# Patient Record
Sex: Female | Born: 1940 | Race: Black or African American | Hispanic: No | Smoking: Never smoker
Health system: Southern US, Community
[De-identification: ages and names within clinical notes are randomized; demographics above are authoritative.]

## PROBLEM LIST (undated history)

## (undated) DIAGNOSIS — I10 Essential (primary) hypertension: Secondary | ICD-10-CM

## (undated) HISTORY — PX: APPENDECTOMY: SHX54

## (undated) HISTORY — PX: ECTOPIC PREGNANCY SURGERY: SHX613

---

## 2010-07-14 ENCOUNTER — Emergency Department (HOSPITAL_COMMUNITY): Admission: EM | Admit: 2010-07-14 | Discharge: 2010-07-14 | Payer: Self-pay | Admitting: Emergency Medicine

## 2011-11-11 ENCOUNTER — Emergency Department (HOSPITAL_COMMUNITY)
Admission: EM | Admit: 2011-11-11 | Discharge: 2011-11-11 | Disposition: A | Discharge status: Home / Self Care | Payer: Medicare PPO | Attending: Emergency Medicine | Admitting: Emergency Medicine

## 2011-11-11 ENCOUNTER — Encounter: Payer: Self-pay | Admitting: *Deleted

## 2011-11-11 DIAGNOSIS — I10 Essential (primary) hypertension: Secondary | ICD-10-CM | POA: Insufficient documentation

## 2011-11-11 DIAGNOSIS — J40 Bronchitis, not specified as acute or chronic: Secondary | ICD-10-CM | POA: Insufficient documentation

## 2011-11-11 HISTORY — DX: Essential (primary) hypertension: I10

## 2011-11-11 MED ORDER — AZITHROMYCIN 250 MG PO TABS: ORAL_TABLET | ORAL | Status: DC

## 2011-11-11 NOTE — ED Provider Notes (Signed)
History   This chart was scribed for Benny Lennert, MD by Clarita Crane. The patient was seen in room APA10/APA10 and the patient's care was started at 8:39AM.   CSN: [id number] Arrival date & time: 11/11/2011  8:13 AM   First MD Initiated Contact with Patient 11/11/11 [phone]      Chief Complaint  Patient presents with  . Cough    (Consider location/radiation/quality/duration/timing/severity/associated sxs/prior treatment) HPI Jean Miles is a 70 y.o. female who presents to the Emergency Department complaining of constant moderate productive cough with yellow sputum onset several days ago with associated chills, myalgias, nasal congestion, chest congestion. Denies fever, sore throat, nausea. Patient reports that she received flu shot several weeks ago. Patient with h/o hypertension.   Past Medical History  Diagnosis Date  . Hypertension     Past Surgical History  Procedure Date  . Ectopic pregnancy surgery   . Appendectomy     No family history on file.  History  Substance Use Topics  . Smoking status: Never Smoker   . Smokeless tobacco: Not on file  . Alcohol Use: No    OB History    Grav Para Term Preterm Abortions TAB SAB Ect Mult Living                  Review of Systems  Constitutional: Positive for chills. Negative for fever and fatigue.  HENT: Positive for congestion. Negative for sore throat, sinus pressure and ear discharge.   Eyes: Negative for discharge.  Respiratory: Positive for cough.   Cardiovascular: Negative for chest pain.  Gastrointestinal: Negative for nausea, abdominal pain and diarrhea.  Genitourinary: Negative for frequency and hematuria.  Musculoskeletal: Positive for myalgias. Negative for back pain.  Skin: Negative for rash.  Neurological: Negative for seizures and headaches.  Hematological: Negative.   Psychiatric/Behavioral: Negative for hallucinations.    Allergies  Penicillins  Home Medications   Current Outpatient Rx    Name Route Sig Dispense Refill  . AZITHROMYCIN 250 MG PO TABS  Take 2 tablets today the one each day after 6 tablet 0    BP 141/66  Pulse 58  Temp(Src) 98 F (36.7 C) (Oral)  Resp 20  Ht 5\' 5"  (1.651 m)  Wt 178 lb (80.74 kg)  BMI 29.62 kg/m2  SpO2 97%  Physical Exam  Nursing note and vitals reviewed. Constitutional: She is oriented to person, place, and time. She appears well-developed and well-nourished. No distress.  HENT:  Head: Normocephalic and atraumatic.  Mouth/Throat: Oropharynx is clear and moist.       Mild maxillary sinus tenderness.   Eyes: EOM are normal. Pupils are equal, round, and reactive to light.  Neck: Neck supple. No tracheal deviation present.  Cardiovascular: Normal rate, regular rhythm and intact distal pulses.  Exam reveals no gallop and no friction rub.   No murmur heard. Pulmonary/Chest: Effort normal. No respiratory distress. She has no wheezes.  Abdominal: Soft. She exhibits no distension. There is no tenderness.  Musculoskeletal: Normal range of motion. She exhibits no edema.  Neurological: She is alert and oriented to person, place, and time. No sensory deficit.  Skin: Skin is warm and dry.  Psychiatric: She has a normal mood and affect. Her behavior is normal.    ED Course  Procedures (including critical care time)  DIAGNOSTIC STUDIES: Oxygen Saturation is 97% on room air, normal by my interpretation.    COORDINATION OF CARE:    Labs Reviewed - No data to display No  results found.   1. Bronchitis       MDM        The chart was scribed for me under my direct supervision.  I personally performed the history, physical, and medical decision making and all procedures in the evaluation of this patient.Benny Lennert, MD 11/11/11 [phone]

## 2011-11-11 NOTE — ED Notes (Signed)
Cough, congestion, runny nose x 4 days.  Taking OTC meds without relief.  Denies fever/sob.

## 2013-09-12 ENCOUNTER — Emergency Department (HOSPITAL_COMMUNITY)
Admission: EM | Admit: 2013-09-12 | Discharge: 2013-09-12 | Disposition: A | Discharge status: Home / Self Care | Payer: Medicare PPO | Attending: Emergency Medicine | Admitting: Emergency Medicine

## 2013-09-12 ENCOUNTER — Encounter (HOSPITAL_COMMUNITY): Payer: Self-pay | Admitting: Emergency Medicine

## 2013-09-12 DIAGNOSIS — S39012A Strain of muscle, fascia and tendon of lower back, initial encounter: Secondary | ICD-10-CM

## 2013-09-12 DIAGNOSIS — I10 Essential (primary) hypertension: Secondary | ICD-10-CM | POA: Insufficient documentation

## 2013-09-12 DIAGNOSIS — S335XXA Sprain of ligaments of lumbar spine, initial encounter: Secondary | ICD-10-CM | POA: Insufficient documentation

## 2013-09-12 DIAGNOSIS — Z880 Allergy status to penicillin: Secondary | ICD-10-CM | POA: Insufficient documentation

## 2013-09-12 DIAGNOSIS — Z792 Long term (current) use of antibiotics: Secondary | ICD-10-CM | POA: Insufficient documentation

## 2013-09-12 DIAGNOSIS — Y929 Unspecified place or not applicable: Secondary | ICD-10-CM | POA: Insufficient documentation

## 2013-09-12 DIAGNOSIS — X58XXXA Exposure to other specified factors, initial encounter: Secondary | ICD-10-CM | POA: Insufficient documentation

## 2013-09-12 DIAGNOSIS — I498 Other specified cardiac arrhythmias: Secondary | ICD-10-CM | POA: Insufficient documentation

## 2013-09-12 DIAGNOSIS — Y939 Activity, unspecified: Secondary | ICD-10-CM | POA: Insufficient documentation

## 2013-09-12 LAB — URINALYSIS, ROUTINE W REFLEX MICROSCOPIC
Bilirubin Urine: NEGATIVE
Glucose, UA: NEGATIVE mg/dL
Nitrite: NEGATIVE
Protein, ur: NEGATIVE mg/dL
pH: 6.5 (ref 5.0–8.0)

## 2013-09-12 MED ORDER — METHOCARBAMOL 500 MG PO TABS: 500.0000 mg | ORAL_TABLET | Freq: Two times a day (BID) | ORAL | Status: DC

## 2013-09-12 NOTE — ED Notes (Signed)
Pt ambulated out with steady gait, pt refused wheelchair at time of discharge

## 2013-09-12 NOTE — ED Provider Notes (Signed)
Medical screening examination/treatment/procedure(s) were performed by non-physician practitioner and as supervising physician I was immediately available for consultation/collaboration. Devoria Albe, MD, Armando Gang    Ward Givens, MD 09/12/13 [phone]

## 2013-09-12 NOTE — ED Notes (Signed)
Pt with right lower back pain x 4-5 days, denies fever or N/V, denies burning or pain with urination

## 2013-09-12 NOTE — ED Notes (Signed)
R lower back pain began 4 days ago.  Intermittent, sharp when patient lying or sitting - not as much pain w/walking.  Does not recall any injury.

## 2013-09-12 NOTE — ED Provider Notes (Signed)
CSN: [id number]     Arrival date & time 09/12/13  1042 History   First MD Initiated Contact with Patient 09/12/13 1100     Chief Complaint  Patient presents with  . Back Pain   (Consider location/radiation/quality/duration/timing/severity/associated sxs/prior Treatment) Patient is a 72 y.o. female presenting with back pain. The history is provided by the patient.  Back Pain Location:  Lumbar spine Quality:  Shooting Pain severity:  Moderate Pain is:  Same all the time Onset quality:  Gradual Duration:  4 days Timing:  Intermittent Chronicity:  New Relieved by:  None tried Worsened by:  Movement, ambulation and twisting Ineffective treatments:  OTC medications Associated symptoms: no bladder incontinence, no bowel incontinence, no dysuria, no fever, no leg pain and no weakness    Jean Miles is a 72 y.o. female who presents to the ED with right lower back pain that started 4 days ago. The pain comes and goes. She describes the pain as sharp. She does not remember any injury but she does walk every day. She got her flu shot 3 days ago in her right arm. Took tylenol and Excedrin which gave temporary relief.    Past Medical History  Diagnosis Date  . Hypertension    Past Surgical History  Procedure Laterality Date  . Ectopic pregnancy surgery    . Appendectomy     History reviewed. No pertinent family history. History  Substance Use Topics  . Smoking status: Never Smoker   . Smokeless tobacco: Not on file  . Alcohol Use: No   OB History   Grav Para Term Preterm Abortions TAB SAB Ect Mult Living                 Review of Systems  Constitutional: Negative for fever.  Gastrointestinal: Negative for bowel incontinence.  Genitourinary: Negative for bladder incontinence and dysuria.  Musculoskeletal: Positive for back pain.  Neurological: Negative for weakness.    Allergies  Penicillins  Home Medications   Current Outpatient Rx  Name  Route  Sig  Dispense   Refill  . azithromycin (ZITHROMAX Z-PAK) 250 MG tablet      Take 2 tablets today the one each day after   6 tablet   0    BP 173/72  Pulse 51  Temp(Src) 98.4 F (36.9 C) (Oral)  Resp 16  Ht 5\' 5"  (1.651 m)  Wt 183 lb (83.008 kg)  BMI 30.45 kg/m2  SpO2 100% Physical Exam  Nursing note and vitals reviewed. Constitutional: She is oriented to person, place, and time. She appears well-developed and well-nourished.  HENT:  Head: Normocephalic and atraumatic.  Eyes: Conjunctivae and EOM are normal.  Neck: Neck supple.  Cardiovascular: Regular rhythm.  Bradycardia present.   Pulmonary/Chest: Effort normal and breath sounds normal.  Abdominal: Soft. There is no tenderness.  Musculoskeletal: Normal range of motion.       Lumbar back: She exhibits tenderness. She exhibits normal range of motion, no spasm and normal pulse.       Back:  Pedal pulses equal bilateral, adequate circulation, good touch sensation. Ambulatory without difficulty.  Neurological: She is alert and oriented to person, place, and time. No cranial nerve deficit.  Skin: Skin is warm and dry.  Psychiatric: She has a normal mood and affect. Her behavior is normal.    ED Course: Dr. Lynelle Doctor in to see the patient.   Procedures  EKG Interpretation   None      Results for orders placed during  the hospital encounter of 09/12/13 (from the past 24 hour(s))  URINALYSIS, ROUTINE W REFLEX MICROSCOPIC     Status: None   Collection Time    09/12/13 11:51 AM      Result Value Range   Color, Urine YELLOW  YELLOW   APPearance CLEAR  CLEAR   Specific Gravity, Urine 1.015  1.005 - 1.030   pH 6.5  5.0 - 8.0   Glucose, UA NEGATIVE  NEGATIVE mg/dL   Hgb urine dipstick NEGATIVE  NEGATIVE   Bilirubin Urine NEGATIVE  NEGATIVE   Ketones, ur NEGATIVE  NEGATIVE mg/dL   Protein, ur NEGATIVE  NEGATIVE mg/dL   Urobilinogen, UA 0.2  0.0 - 1.0 mg/dL   Nitrite NEGATIVE  NEGATIVE   Leukocytes, UA NEGATIVE  NEGATIVE    MDM  72 y.o.  female with right lumbar muscle spasm x 4 days. Will treat with muscle relaxant. Discussed with the patient and her daughter side affects of Robaxin including drowsiness. She will try the medication at bedtime to determine how it makes her feel. Patient is stable for discharge home without any immediate complications. She will follow up with her PCP. She will return here as needed for any problems.    Medication List    TAKE these medications       methocarbamol 500 MG tablet  Commonly known as:  ROBAXIN  Take 1 tablet (500 mg total) by mouth 2 (two) times daily.      ASK your doctor about these medications       azithromycin 250 MG tablet  Commonly known as:  ZITHROMAX Z-PAK  Take 2 tablets today the one each day after           Janne Napoleon, NP 09/12/13 1233

## 2013-09-12 NOTE — ED Provider Notes (Signed)
12:22 PM- The pt states the pain to her lower back began approximately four days ago. Changing positions or bending increases the pain. Advised the pt that her UA looked normal. Informed the pt that she most likely had a muscle strain. The pt reports she used a saw to cut down several bushes two weeks ago.   Pt has tenderness in her lateral right flank, she has pain with lateral flexion of her waist with the right worse than the left.   Medical screening examination/treatment/procedure(s) were conducted as a shared visit with non-physician practitioner(s) and myself.  I personally evaluated the patient during the encounter   Devoria Albe, MD, FACEP  I personally performed the services described in this documentation, which was scribed in my presence. The recorded information has been reviewed and considered.  Devoria Albe, MD, Armando Gang   Ward Givens, MD 09/12/13 [phone]

## 2013-09-15 ENCOUNTER — Encounter (HOSPITAL_COMMUNITY): Payer: Self-pay | Admitting: Emergency Medicine

## 2013-09-15 ENCOUNTER — Emergency Department (HOSPITAL_COMMUNITY): Payer: Medicare PPO

## 2013-09-15 ENCOUNTER — Emergency Department (HOSPITAL_COMMUNITY)
Admission: EM | Admit: 2013-09-15 | Discharge: 2013-09-15 | Disposition: A | Discharge status: Home / Self Care | Payer: Medicare PPO | Attending: Emergency Medicine | Admitting: Emergency Medicine

## 2013-09-15 DIAGNOSIS — Z792 Long term (current) use of antibiotics: Secondary | ICD-10-CM | POA: Insufficient documentation

## 2013-09-15 DIAGNOSIS — M25559 Pain in unspecified hip: Secondary | ICD-10-CM | POA: Insufficient documentation

## 2013-09-15 DIAGNOSIS — I10 Essential (primary) hypertension: Secondary | ICD-10-CM | POA: Insufficient documentation

## 2013-09-15 DIAGNOSIS — Z880 Allergy status to penicillin: Secondary | ICD-10-CM | POA: Insufficient documentation

## 2013-09-15 DIAGNOSIS — Z79899 Other long term (current) drug therapy: Secondary | ICD-10-CM | POA: Insufficient documentation

## 2013-09-15 DIAGNOSIS — M25551 Pain in right hip: Secondary | ICD-10-CM

## 2013-09-15 MED ORDER — PREDNISONE 20 MG PO TABS: ORAL_TABLET | ORAL | Status: DC

## 2013-09-15 MED ORDER — NAPROXEN 500 MG PO TABS: 500.0000 mg | ORAL_TABLET | Freq: Two times a day (BID) | ORAL | Status: DC

## 2013-09-15 MED ORDER — KETOROLAC TROMETHAMINE 60 MG/2ML IM SOLN
60.0000 mg | Freq: Once | INTRAMUSCULAR | Status: AC
  Administered 2013-09-15: 60 mg via INTRAMUSCULAR
  Filled 2013-09-15: qty 2

## 2013-09-15 NOTE — ED Notes (Signed)
Pt on shock time until 1500.

## 2013-09-15 NOTE — ED Provider Notes (Signed)
CSN: [id number]     Arrival date & time 09/15/13  1104 History  This chart was scribed for Jean Hutching, MD by Karle Plumber, ED Scribe. This patient was seen in room APA03/APA03 and the patient's care was started at 11:32 AM.  Chief Complaint  Patient presents with  . Back Pain   The history is provided by the patient. No language interpreter was used.   HPI Comments:  Jean Miles is a 72 y.o. female who presents to the Emergency Department complaining of sudden onset, sharp, intermittent lower right back/pelvis pain that radiates to her right leg onset 3 days ago. She is able to ambulate without issue. She denies injury or fall.   Past Medical History  Diagnosis Date  . Hypertension    Past Surgical History  Procedure Laterality Date  . Ectopic pregnancy surgery    . Appendectomy     No family history on file. History  Substance Use Topics  . Smoking status: Never Smoker   . Smokeless tobacco: Not on file  . Alcohol Use: No   OB History   Grav Para Term Preterm Abortions TAB SAB Ect Mult Living                 Review of Systems  Musculoskeletal: Positive for back pain and myalgias.  A complete 10 system review of systems was obtained and all systems are negative except as noted in the HPI and PMH.   Allergies  Penicillins  Home Medications   Current Outpatient Rx  Name  Route  Sig  Dispense  Refill  . azithromycin (ZITHROMAX Z-PAK) 250 MG tablet      Take 2 tablets today the one each day after   6 tablet   0   . methocarbamol (ROBAXIN) 500 MG tablet   Oral   Take 1 tablet (500 mg total) by mouth 2 (two) times daily.   20 tablet   0    Triage Vitals: BP 145/62  Pulse 60  Temp(Src) 98.2 F (36.8 C) (Oral)  Resp 18  Ht 5\' 6"  (1.676 m)  Wt 183 lb (83.008 kg)  BMI 29.55 kg/m2  SpO2 99% Physical Exam  Nursing note and vitals reviewed. Constitutional: She is oriented to person, place, and time. She appears well-developed and well-nourished.  HENT:   Head: Normocephalic and atraumatic.  Eyes: Conjunctivae and EOM are normal. Pupils are equal, round, and reactive to light.  Neck: Normal range of motion. Neck supple.  Cardiovascular: Normal rate, regular rhythm and normal heart sounds.   Pulmonary/Chest: Effort normal and breath sounds normal.  Abdominal: Soft. Bowel sounds are normal.  Musculoskeletal: Normal range of motion.  Tender on left lateral hip. Straight leg raises without pain.  Neurological: She is alert and oriented to person, place, and time.  Skin: Skin is warm and dry.  Psychiatric: She has a normal mood and affect.    ED Course  Procedures (including critical care time) DIAGNOSTIC STUDIES: Oxygen Saturation is 99% on RA, normal by my interpretation.   COORDINATION OF CARE: 11:39 AM- Will obtain an X-Ray. Pt verbalizes understanding and agrees to plan.  Medications - No data to display  Labs Review Labs Reviewed - No data to display Imaging Review Dg Hip Complete Right  09/15/2013   CLINICAL DATA:  Hip pain. No known injury.  EXAM: RIGHT HIP - COMPLETE 2+ VIEW  COMPARISON:  None.  FINDINGS: Both hips are normally located. No acute hip fracture or plain film evidence  of avascular necrosis. The hips demonstrate mild to moderate degenerative changes. The pubic symphysis and SI joints are intact. No pelvic fractures.  IMPRESSION: Mild to moderate degenerative changes but no acute bony findings   Electronically Signed   By: Loralie Champagne M.D.   On: 09/15/2013 12:15    EKG Interpretation   None       MDM  No diagnosis found. Uncertain etiology of patient's right lateral hip pain. Could be trochanteric bursitis.  Rx Naprosyn 500 mg and prednisone. Followup with orthopedics.  I personally performed the services described in this documentation, which was scribed in my presence. The recorded information has been reviewed and is accurate.    Jean Hutching, MD 09/15/13 [phone]

## 2013-09-15 NOTE — ED Notes (Signed)
MD at bedside. 

## 2013-09-15 NOTE — ED Notes (Signed)
Pt c/o pain in lower back x 3 days.  Reports was seen here for same.  Pt says now pain radiates into r leg.  Denies injury.

## 2013-10-02 ENCOUNTER — Encounter: Payer: Self-pay | Admitting: Orthopedic Surgery

## 2013-10-02 ENCOUNTER — Ambulatory Visit: Payer: Medicare FFS

## 2013-10-02 ENCOUNTER — Ambulatory Visit (INDEPENDENT_AMBULATORY_CARE_PROVIDER_SITE_OTHER): Payer: Medicare PPO | Admitting: Orthopedic Surgery

## 2013-10-02 VITALS — BP 154/78 | Ht 65.0 in | Wt 181.0 lb

## 2013-10-02 DIAGNOSIS — M76899 Other specified enthesopathies of unspecified lower limb, excluding foot: Secondary | ICD-10-CM

## 2013-10-02 DIAGNOSIS — M79609 Pain in unspecified limb: Secondary | ICD-10-CM

## 2013-10-02 DIAGNOSIS — M7070 Other bursitis of hip, unspecified hip: Secondary | ICD-10-CM | POA: Insufficient documentation

## 2013-10-02 DIAGNOSIS — M7071 Other bursitis of hip, right hip: Secondary | ICD-10-CM

## 2013-10-02 DIAGNOSIS — M79604 Pain in right leg: Secondary | ICD-10-CM

## 2013-10-02 MED ORDER — HYDROCODONE-ACETAMINOPHEN 5-325 MG PO TABS: 1.0000 | ORAL_TABLET | Freq: Four times a day (QID) | ORAL | Status: DC | PRN

## 2013-10-02 NOTE — Progress Notes (Signed)
  Subjective:    Patient ID: Jean Miles, female    DOB: 1941-08-29, 72 y.o.   MRN: [id number]  HPI Comments: The patient walks 4 miles per day denies back pain  Leg Pain  The incident occurred more than 1 week ago. The incident occurred at home. There was no injury mechanism. The pain is present in the right thigh. The quality of the pain is described as burning Lambert Mody, throbbing). The pain is at a severity of 8/10 (Relieved by oxycodone). The pain has been intermittent since onset. Associated symptoms include numbness and tingling. Pertinent negatives include no inability to bear weight or loss of motion.      Review of Systems  Neurological: Positive for tingling and numbness.   heartburn joint pain muscle pain     Objective:   Physical Exam BP 154/78  Ht 5\' 5"  (1.651 m)  Wt 181 lb (82.101 kg)  BMI 30.12 kg/m2 General appearance is normal, the patient is alert and oriented x3 with normal mood and affect. The patient's gait and station are normal. She has hip flexion of 90 bilaterally but no pain with hip flexion or internal or external rotation of the hip she has no tenderness in the back of both hips and knees are stable muscle strength and tone are normal skin is intact she has a good pulse normal temperature in both feet reflexes are 2+ and normal is tenderness over the right greater trochanter  The hip x-rays show mild degenerative changes nothing significant       Assessment & Plan:   Encounter Diagnoses  Name Primary?  . Right leg pain Yes  . Hip bursitis, right     Recommend injection right hip patient consented verbally   Inject RIGHT hip bursa Hip  Injection Procedure Note  Pre-operative Diagnosis: right hip Bursitis Post-operative Diagnosis: same  Indications: pain  Anesthesia: ethyl chloride   Procedure Details   Verbal consent was obtained for the procedure. Time out was completed.The RIGHT greater trochanter was prepped with alcohol, followed  by  Ethyl chloride spray and A 25 gauge needle was inserted into the area of maximal tenderness.  1ml 1% lidocaine and 1 ml of depomedrol  was then injected into the bursa . The needle was removed and the area cleansed and dressed.  Complications:  None; patient tolerated the procedure well.

## 2013-10-02 NOTE — Addendum Note (Signed)
Addended by: Vickki Hearing on: 10/02/2013 09:46 AM   Modules accepted: Orders

## 2013-10-02 NOTE — Patient Instructions (Addendum)
Bursitis hip Hip Bursitis Bursitis is a puffiness (swelling) and soreness of a fluid-filled sac (bursa). This sac covers and protects the joint. HOME CARE  Put ice on the injured area.  Put ice in a plastic bag.  Place a towel between your skin and the bag.  Leave the ice on for 15-20 minutes, 3-4 times a day.  Rest the painful joint as much as possible. Move your joint at least 4 times a day. When pain lessens, start normal, slow movements and normal activities.  Only take medicine as told by your doctor.  Use crutches as told.  Raise (elevate) your painful joint. Use pillows for propping your legs and hips.  Get a massage to lessen pain. GET HELP RIGHT AWAY IF:  Your pain increases or does not improve during treatment.  You have a fever.  You feel heat coming from the affected area.  You see redness and puffiness around the affected area.  You have any questions or concerns. MAKE SURE YOU:  Understand these instructions.  Will watch your condition.  Will get help right away if you are not well or get worse. Document Released: 12/17/2010 Document Revised: 02/06/2012 Document Reviewed: 12/17/2010 [facility] Patient Information 2014 [place], [place].

## 2014-02-17 IMAGING — CR DG HIP COMPLETE 2+V*R*
3 series · 3 of 3 positions shown · non-contrast
Comparison: None.

CLINICAL DATA: Hip pain. No known injury.

EXAM:
RIGHT HIP - COMPLETE 2+ VIEW

[view not recorded (1 of 3)]
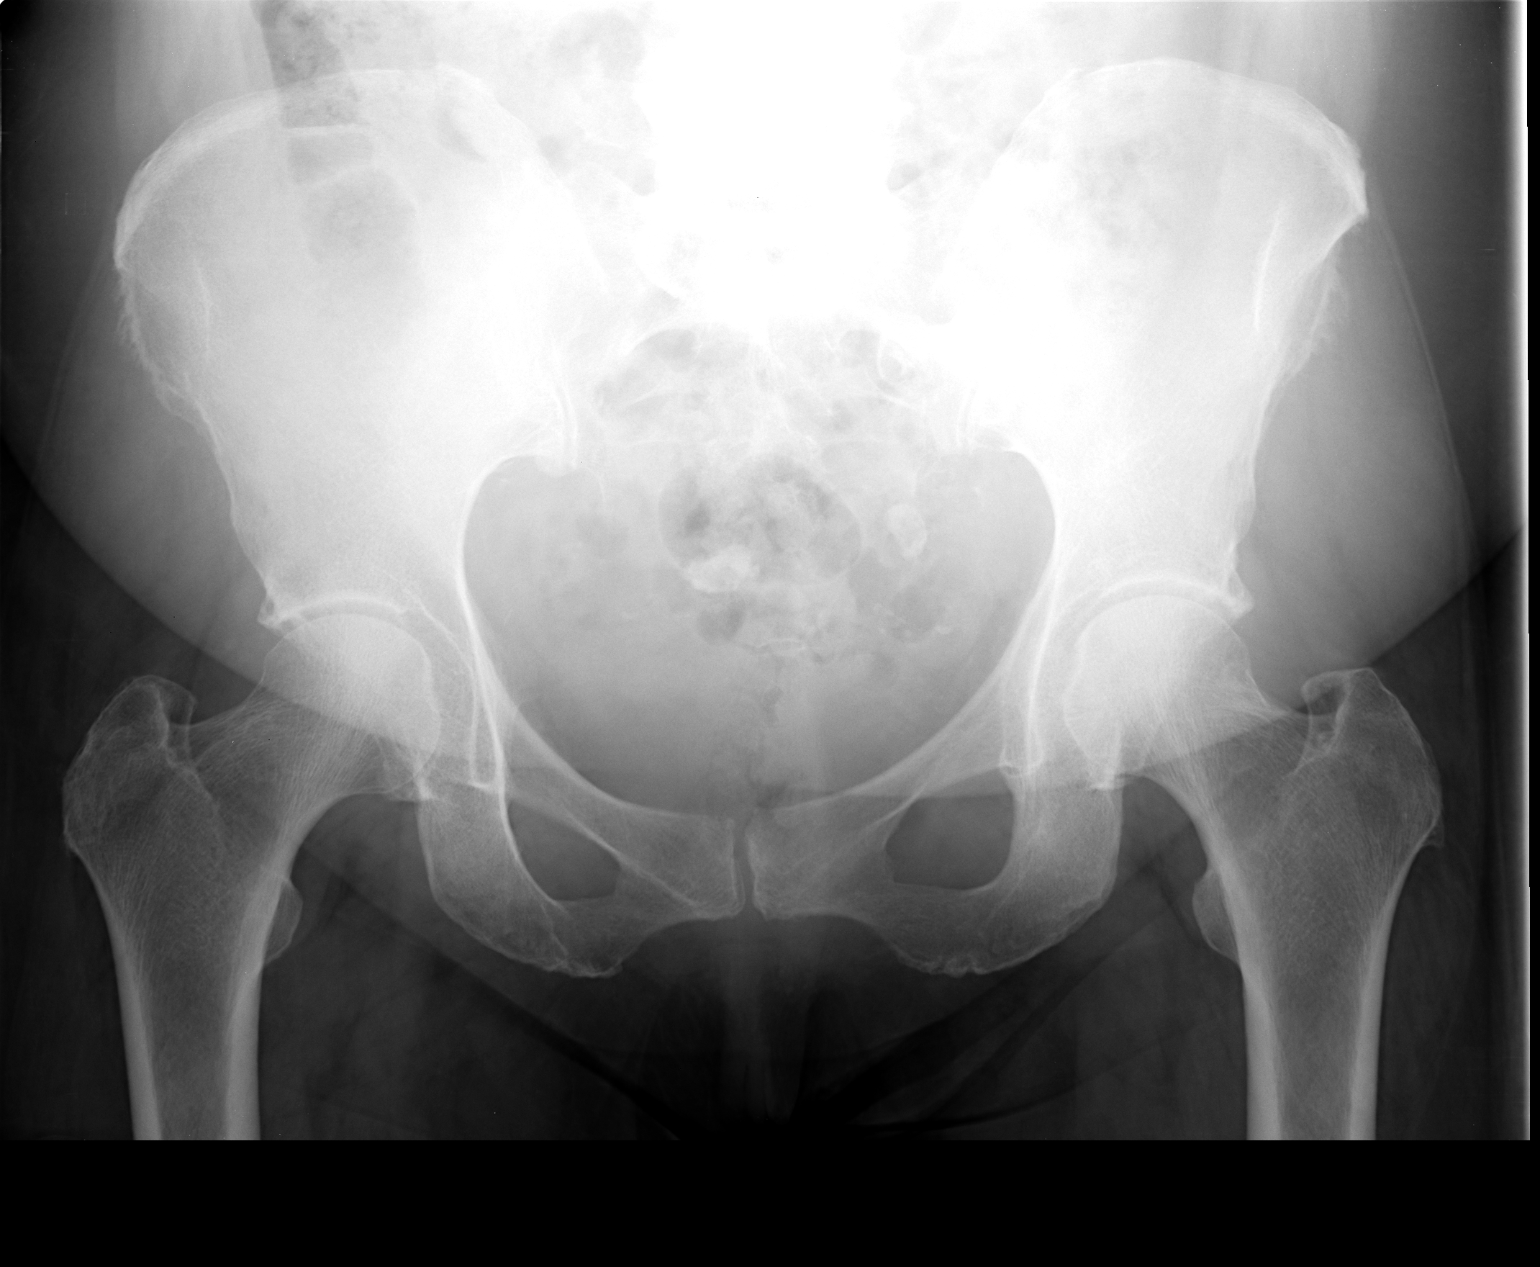

[view not recorded (2 of 3)]
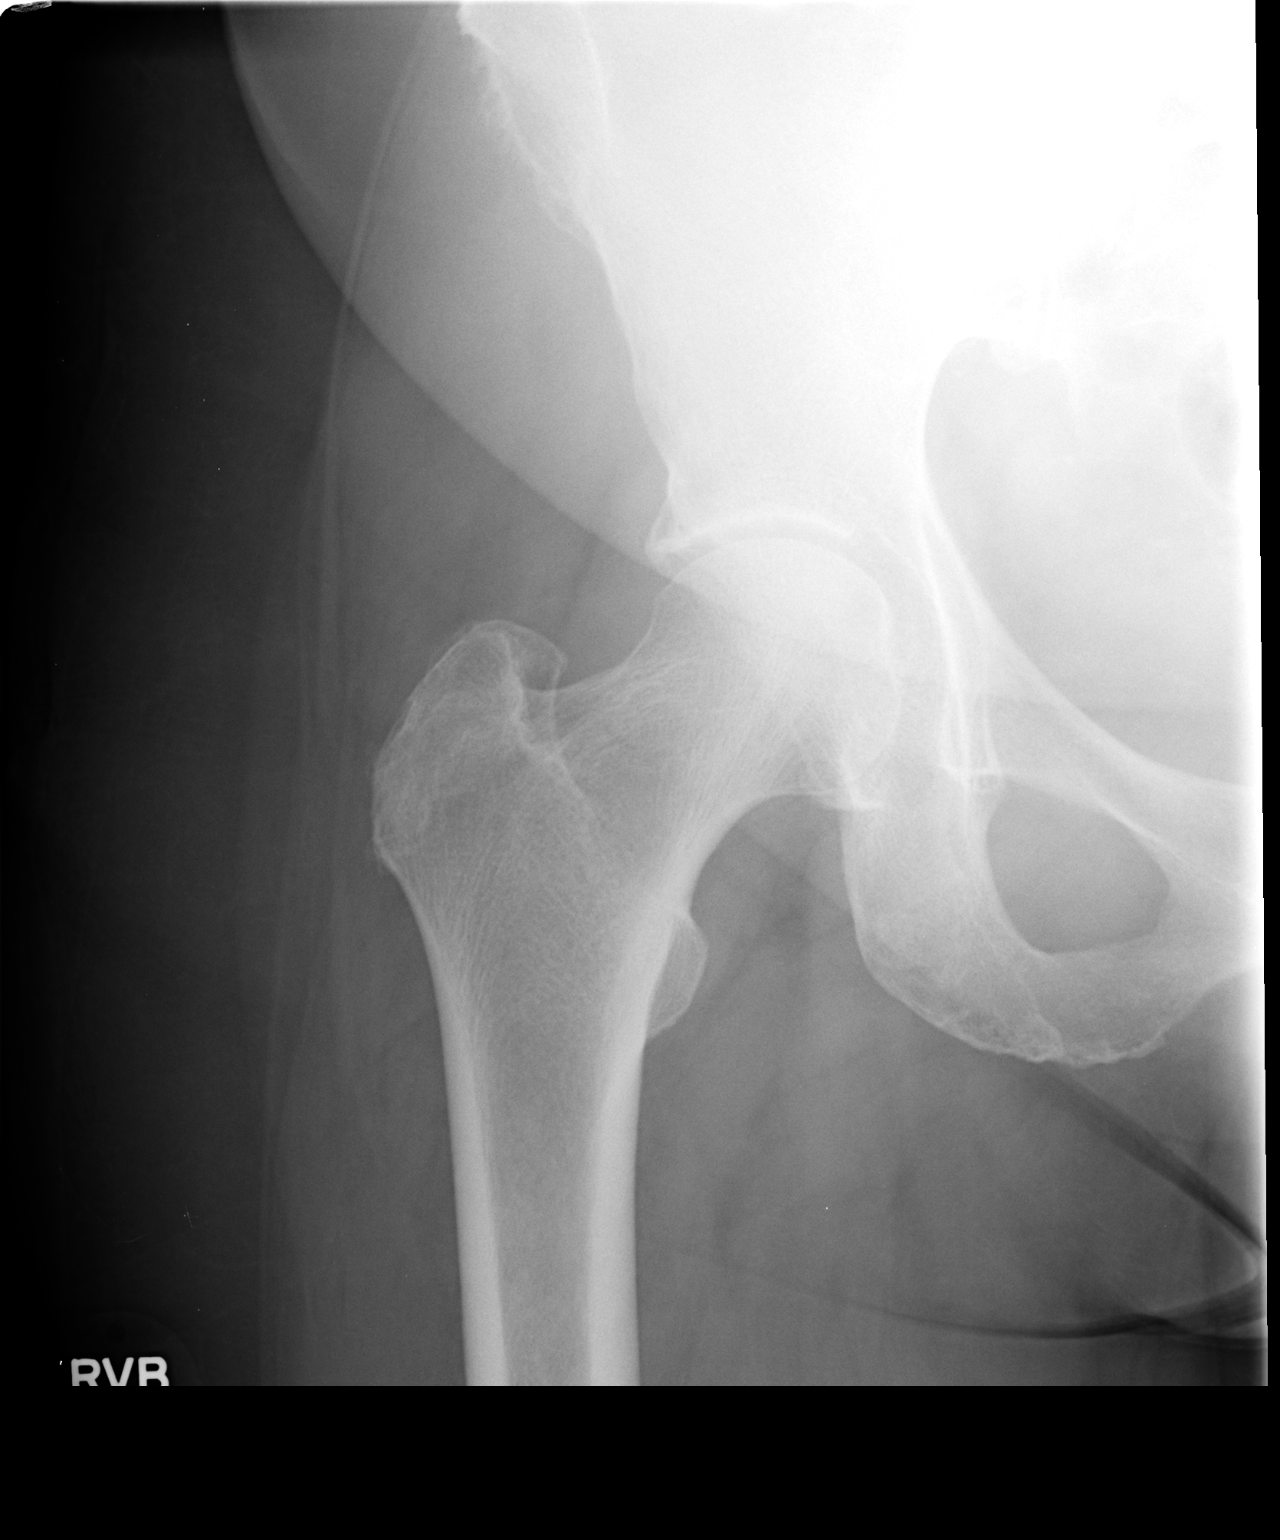

[view not recorded (3 of 3)]
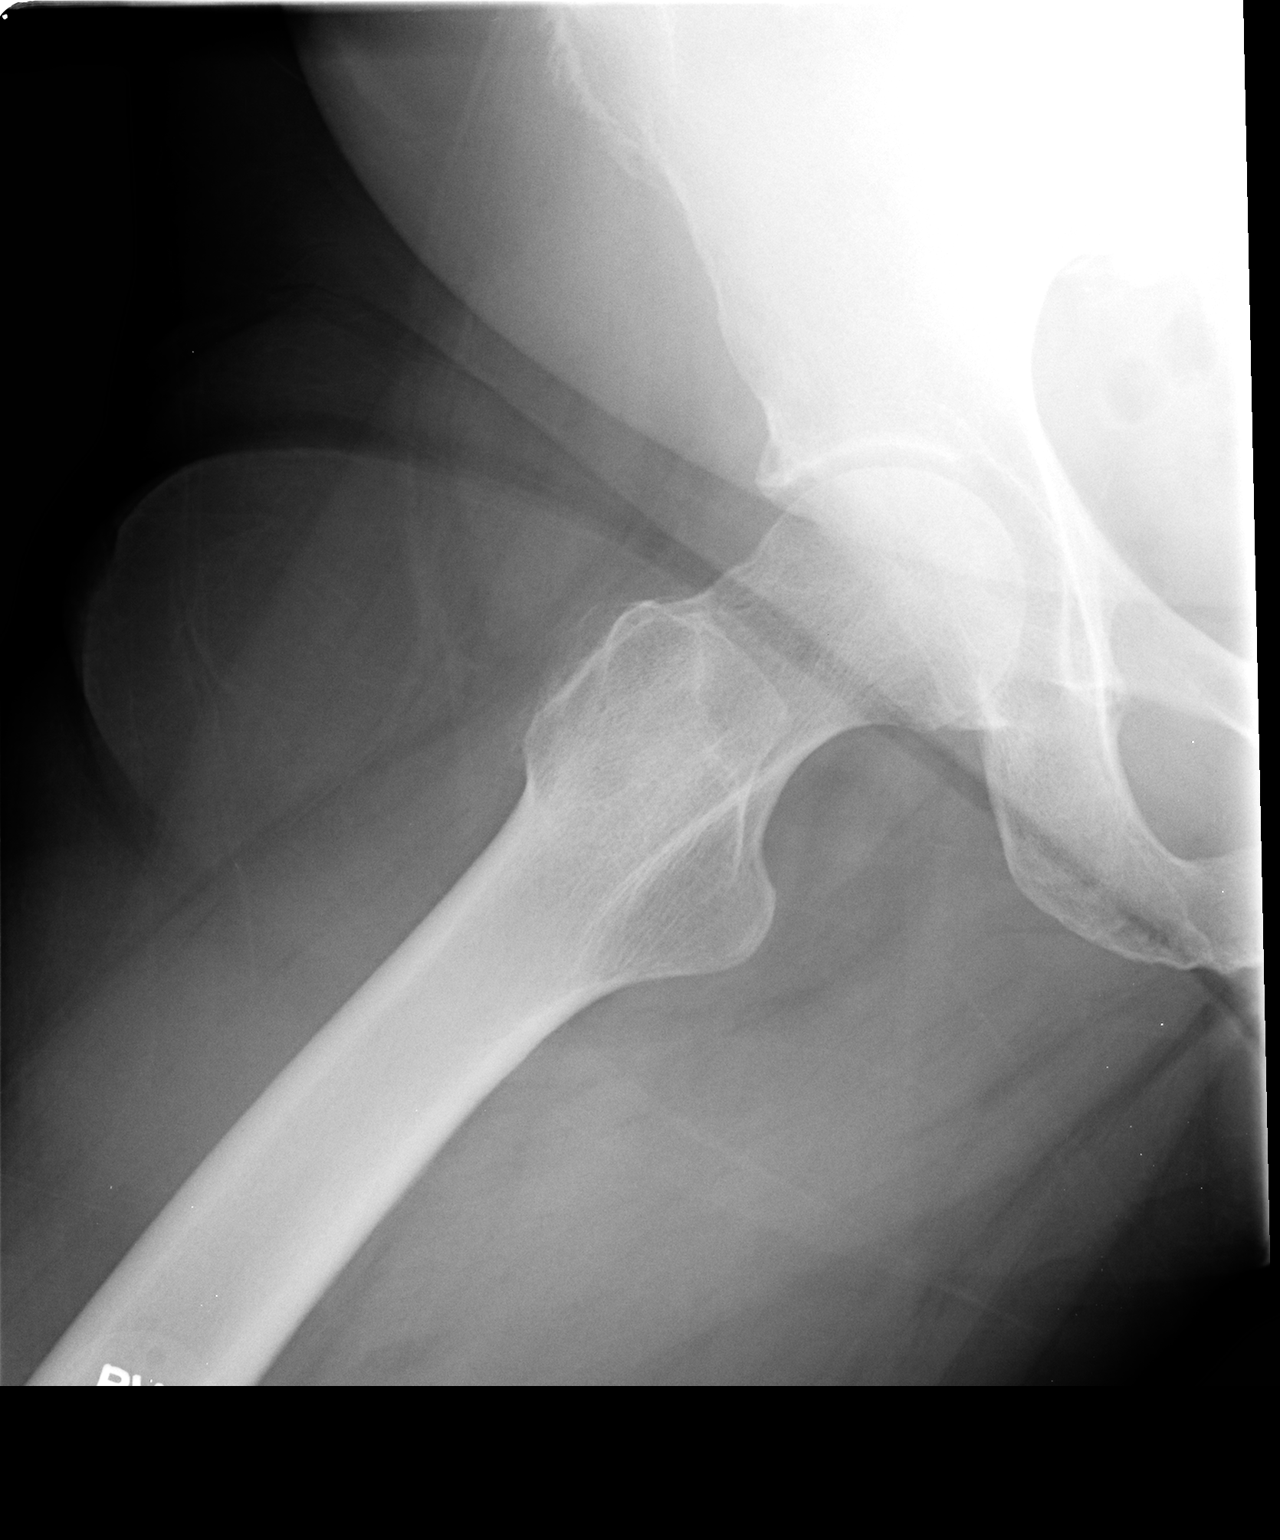

[3 of 3 positions shown; findings below may reference images not displayed]

FINDINGS: Both hips are normally located. No acute hip fracture or plain film
evidence of avascular necrosis. The hips demonstrate mild to
moderate degenerative changes. The pubic symphysis and SI joints are
intact. No pelvic fractures.
IMPRESSION: Mild to moderate degenerative changes but no acute bony findings

## 2014-05-06 ENCOUNTER — Ambulatory Visit (INDEPENDENT_AMBULATORY_CARE_PROVIDER_SITE_OTHER): Payer: Medicare FFS

## 2014-05-06 ENCOUNTER — Ambulatory Visit (INDEPENDENT_AMBULATORY_CARE_PROVIDER_SITE_OTHER): Payer: Medicare FFS | Admitting: Orthopedic Surgery

## 2014-05-06 VITALS — BP 155/80 | Ht 65.0 in | Wt 176.0 lb

## 2014-05-06 DIAGNOSIS — M6530 Trigger finger, unspecified finger: Secondary | ICD-10-CM

## 2014-05-06 DIAGNOSIS — M79643 Pain in unspecified hand: Secondary | ICD-10-CM

## 2014-05-06 DIAGNOSIS — M79609 Pain in unspecified limb: Secondary | ICD-10-CM

## 2014-05-06 NOTE — Progress Notes (Signed)
Patient ID: Jean Miles, female   DOB: 03-29-1941, 73 y.o.   MRN: [id number]  Chief Complaint  Patient presents with  . Hand Injury    Right hand pain and finger pain, DOI 02-09-14.    BP 155/80  Ht 5\' 5"  (1.651 m)  Wt 176 lb (79.833 kg)  BMI 29.24 kg/m18  73 year old female complains of pain over the A1 pulley of her right long finger after crushing her fingers in a garage door. Chest catching locking and decreased extension at times. Review of systems negative.  Appearance normal oriented x3 mood normal gait normal  Tenderness over the A1 pulley of the right long finger shows some deformity of the right long and ring finger nails but they actually grew back adequately. She has full flexion but catching on extension. No instability in the hand or wrist grip strength decreased by the A1 pulley pain scans intact pulses are normal sensation is normal  Trigger finger right long finger  Recommend injection  Procedure note trigger finger injection   Procedure note trigger finger injection  Diagnosis trigger finger Postop diagnosis trigger finger Procedure injection of trigger finger Finger injected right long   Details of procedure: After verbal consent and timeout to confirm site the right long finger  was injected with 1 cc of 40 mg of Depo-Medrol and 1 cc of 1% lidocaine  The procedure was tolerated well without complication

## 2014-05-06 NOTE — Patient Instructions (Signed)
You have received a steroid shot. 15% of patients experience increased pain at the injection site with in the next 24 hours. This is best treated with ice and tylenol extra strength 2 tabs every 8 hours. If you are still having pain please call the office.    

## 2015-04-09 ENCOUNTER — Encounter: Payer: Self-pay | Admitting: Orthopedic Surgery

## 2015-04-09 ENCOUNTER — Ambulatory Visit (INDEPENDENT_AMBULATORY_CARE_PROVIDER_SITE_OTHER): Payer: Medicare Other | Admitting: Orthopedic Surgery

## 2015-04-09 VITALS — BP 154/77 | Ht 65.0 in | Wt 182.0 lb

## 2015-04-09 DIAGNOSIS — M654 Radial styloid tenosynovitis [de Quervain]: Secondary | ICD-10-CM | POA: Diagnosis not present

## 2015-04-09 NOTE — Patient Instructions (Signed)
Continue meloxicam    Brace 6 weeks

## 2015-04-09 NOTE — Progress Notes (Signed)
Patient ID: Jean Miles Kastelic, female   DOB: February 22, 1941, 74 y.o.   MRN: [id number] Patient ID: Jean Miles Coco, female   DOB: February 22, 1941, 74 y.o.   MRN: [id number]  Chief Complaint  Patient presents with  . Wrist Pain    Left wrist pain, knot. x 4 months.     Jean Miles Gaulin is a 74 y.o. female.   HPI This 74 year old female right hand dominant presents with left-sided wrist pain over the first extensor compartment. She describes 7 out of 10 constant aching unrelieved by Advil, topical creams and meloxicam which she took for a couple of days. Review of Systems Review of Systems  Musculoskeletal: Positive for joint pain.  Neurological: Positive for sensory change.  All other systems reviewed and are negative.   Past Medical History  Diagnosis Date  . Hypertension     Past Surgical History  Procedure Laterality Date  . Ectopic pregnancy surgery    . Appendectomy      History reviewed. No pertinent family history.  Social History History  Substance Use Topics  . Smoking status: Never Smoker   . Smokeless tobacco: Not on file  . Alcohol Use: No    Allergies  Allergen Reactions  . Penicillins     Current Outpatient Prescriptions  Medication Sig Dispense Refill  . azithromycin (ZITHROMAX Z-PAK) 250 MG tablet Take 2 tablets today the one each day after 6 tablet 0  . HYDROcodone-acetaminophen (NORCO/VICODIN) 5-325 MG per tablet Take 1 tablet by mouth every 6 (six) hours as needed for moderate pain. 60 tablet 0  . methocarbamol (ROBAXIN) 500 MG tablet Take 1 tablet (500 mg total) by mouth 2 (two) times daily. 20 tablet 0  . naproxen (NAPROSYN) 500 MG tablet Take 1 tablet (500 mg total) by mouth 2 (two) times daily. 30 tablet 0  . predniSONE (DELTASONE) 20 MG tablet 3 tabs po day one, then 2 po daily x 4 days 11 tablet 0   No current facility-administered medications for this visit.       Physical Exam Blood pressure 154/77, height 5\' 5"  (1.651 m), weight 182 lb (82.555  kg). Physical Exam  Constitutional: She is oriented to person, place, and time. She appears well-developed and well-nourished.  Cardiovascular: Intact distal pulses.   Musculoskeletal:  Left thumb tenderness over the first extensor compartment positive ulnar deviation de Quervain's sign painful ulnar deviation wrist stability normal motor exam normal  Neurological: She is alert and oriented to person, place, and time. She has normal reflexes. She exhibits normal muscle tone.  Skin: Skin is warm and dry.  Psychiatric: She has a normal mood and affect. Her behavior is normal. Judgment and thought content normal.    Data Reviewed [facility] the disc imaging of the forearm negative includes the wrist   Assessment Encounter Diagnosis  Name Primary?  Tommi Rumps. De Quervain's disease (radial styloid tenosynovitis) Yes    Plan Splint ice anti-inflammatories 6 week follow-up

## 2015-05-21 ENCOUNTER — Ambulatory Visit (INDEPENDENT_AMBULATORY_CARE_PROVIDER_SITE_OTHER): Payer: Medicare Other | Admitting: Orthopedic Surgery

## 2015-05-21 VITALS — BP 173/85 | Ht 65.0 in | Wt 182.0 lb

## 2015-05-21 DIAGNOSIS — M654 Radial styloid tenosynovitis [de Quervain]: Secondary | ICD-10-CM

## 2015-05-21 NOTE — Progress Notes (Signed)
Chief Complaint  Patient presents with  . Follow-up    6 week follow up Left DQV s/p brace    The patient had de Quervain's syndrome of the left wrist 3 (splint gave her some medication and she has improved she has no pain  She has a negative Finkelstein's full range of motion  She is discharged follow-up if any problems

## 2016-08-08 ENCOUNTER — Ambulatory Visit (INDEPENDENT_AMBULATORY_CARE_PROVIDER_SITE_OTHER): Payer: Medicare Other | Admitting: Orthopedic Surgery

## 2016-08-08 ENCOUNTER — Encounter: Payer: Self-pay | Admitting: Orthopedic Surgery

## 2016-08-08 ENCOUNTER — Ambulatory Visit (INDEPENDENT_AMBULATORY_CARE_PROVIDER_SITE_OTHER): Payer: Medicare Other

## 2016-08-08 VITALS — BP 173/89 | HR 56 | Ht 65.0 in | Wt 176.0 lb

## 2016-08-08 DIAGNOSIS — M79641 Pain in right hand: Secondary | ICD-10-CM

## 2016-08-08 NOTE — Patient Instructions (Signed)
Apply Aspercreme prior to going to bed

## 2016-08-08 NOTE — Progress Notes (Signed)
Patient ID: Jean Miles, female   DOB: 08-19-1941, 75 y.o.   MRN: [id number]  Chief Complaint  Patient presents with  . Hand Pain    right hand pain    HPI Jean HerrlichSara E Murfin is a 75 y.o. female.  Presents for evaluation of dorsal right hand pain for several months. She has history of traumatic crush injury to the right hand secondary to her garage door complains of pain in the dorsal part of her hand at night. She did take some Tylenol with modest relief. She complains of some stiffness and there is also a not over the ulnar styloid area. There is no numbness or tingling associated with this  Review of Systems Review of Systems  Constitutional: Negative for chills and fever.  Respiratory: Negative for shortness of breath.   Cardiovascular: Negative for chest pain.  Musculoskeletal: Negative.   Neurological: Negative for numbness.     Past Medical History:  Diagnosis Date  . Hypertension     Past Surgical History:  Procedure Laterality Date  . APPENDECTOMY    . ECTOPIC PREGNANCY SURGERY      Social History Social History  Substance Use Topics  . Smoking status: Never Smoker  . Smokeless tobacco: Not on file  . Alcohol use No    Allergies  Allergen Reactions  . Penicillins     Current Meds  Medication Sig  . amLODipine (NORVASC) 10 MG tablet Take 10 mg by mouth daily.  Marland Kitchen. aspirin EC 81 MG tablet Take 81 mg by mouth daily.  . chlorthalidone (HYGROTON) 25 MG tablet Take 25 mg by mouth daily.  Marland Kitchen. lisinopril (PRINIVIL,ZESTRIL) 20 MG tablet Take 20 mg by mouth daily.      Physical Exam Physical Exam BP (!) 173/89   Pulse (!) 56   Ht 5\' 5"  (1.651 m)   Wt 176 lb (79.8 kg)   BMI 29.29 kg/m   Gen. appearance Normal well-groomed The patient is alert and oriented person place and time Mood is normal affect is normal Ambulatory status normal no assistive devices  Exam of the right hand  Inspection nontender prominence over the ulnar styloid ROM normal extension  mild pain with flexion mild tenderness over the dorsum of the hand Stability no instability Strength normal grip strength  Skin: Skin no rash  Pulses: Radial pulse normal  Neuro: Soft touch sensation normal  Left wrist no evidence of prominence   Data Reviewed X-ray does not show any bony excrescence  Assessment    Dorsal wrist pain    Plan    Recommend topical treatment with Aspercreme       Fuller CanadaStanley Harrison 08/08/2016, 2:12 PM
